# Patient Record
Sex: Male | Born: 1970
Health system: Southern US, Community
[De-identification: ages and names within clinical notes are randomized; demographics above are authoritative.]

## PROBLEM LIST (undated history)

## (undated) DIAGNOSIS — F32A Depression, unspecified: Secondary | ICD-10-CM

## (undated) DIAGNOSIS — F329 Major depressive disorder, single episode, unspecified: Secondary | ICD-10-CM

## (undated) DIAGNOSIS — F419 Anxiety disorder, unspecified: Secondary | ICD-10-CM

## (undated) DIAGNOSIS — C801 Malignant (primary) neoplasm, unspecified: Secondary | ICD-10-CM

## (undated) HISTORY — PX: TESTICLE REMOVAL: SHX68

## (undated) HISTORY — PX: NECK SURGERY: SHX720

## (undated) HISTORY — PX: JOINT REPLACEMENT: SHX530

---

## 2008-04-22 ENCOUNTER — Emergency Department: Payer: Self-pay | Admitting: Emergency Medicine

## 2008-04-24 ENCOUNTER — Ambulatory Visit: Payer: Self-pay | Admitting: Urology

## 2008-05-05 ENCOUNTER — Ambulatory Visit: Payer: Self-pay | Admitting: Internal Medicine

## 2008-05-09 ENCOUNTER — Ambulatory Visit: Payer: Self-pay | Admitting: Urology

## 2008-05-10 ENCOUNTER — Ambulatory Visit: Payer: Self-pay | Admitting: Internal Medicine

## 2008-06-05 ENCOUNTER — Ambulatory Visit: Payer: Self-pay | Admitting: Internal Medicine

## 2008-07-05 ENCOUNTER — Ambulatory Visit: Payer: Self-pay | Admitting: Internal Medicine

## 2008-09-06 ENCOUNTER — Ambulatory Visit: Payer: Self-pay | Admitting: Internal Medicine

## 2008-10-05 ENCOUNTER — Ambulatory Visit: Payer: Self-pay | Admitting: Internal Medicine

## 2009-02-05 ENCOUNTER — Ambulatory Visit: Payer: Self-pay

## 2009-02-22 ENCOUNTER — Ambulatory Visit: Payer: Self-pay

## 2009-03-05 ENCOUNTER — Ambulatory Visit: Payer: Self-pay

## 2009-06-07 ENCOUNTER — Ambulatory Visit: Payer: Self-pay | Admitting: Internal Medicine

## 2009-06-19 ENCOUNTER — Ambulatory Visit: Payer: Self-pay | Admitting: Internal Medicine

## 2009-09-30 ENCOUNTER — Ambulatory Visit: Payer: Self-pay | Admitting: Nephrology

## 2009-12-04 IMAGING — CT CT CHEST-ABD-PELV W/ CM
1 of 3 series · 13 of 32 positions shown, 18 images · non-contrast
Comparison: No comparison.

REASON FOR EXAM: testicular  ca
COMMENTS:

PROCEDURE:     CT  - CT CHEST ABDOMEN AND PELVIS W  - May 09, 2008 [DATE]
RESULT:     CT CHEST, ABDOMEN, AND PELVIS
HISTORY: Testicular cancer.
TECHNIQUE: Multiple axial images obtained from the thoracic inlet to the
pubic symphysis, with p.o. contrast and with 100 mL of Zsovue-S63
intravenous contrast.

[Series 2: soft tissue · axial · 0.87mm/px · z∈[-1068,-438]mm · 13 of 142 slices shown, 18 images]
[im 8/142  soft-tissue]
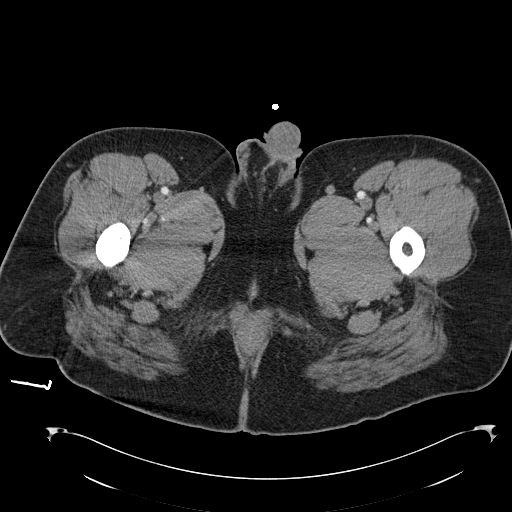
[im 8/142  bone]
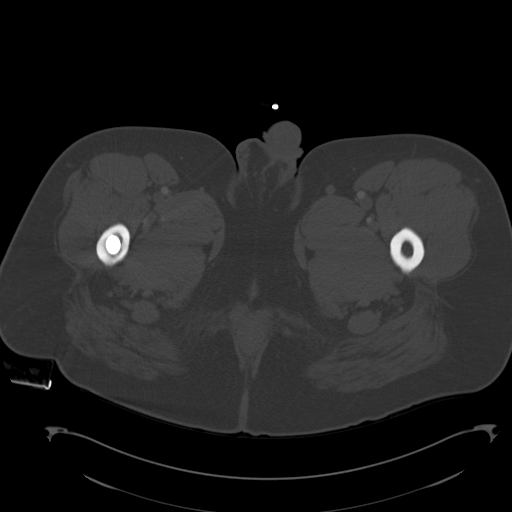
[im 22/142  soft-tissue]
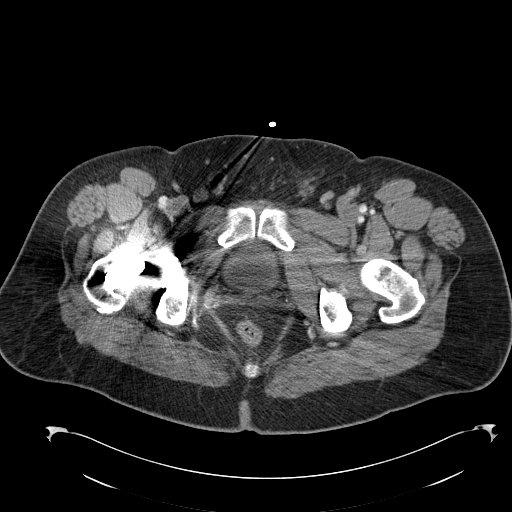
[im 29/142  soft-tissue]
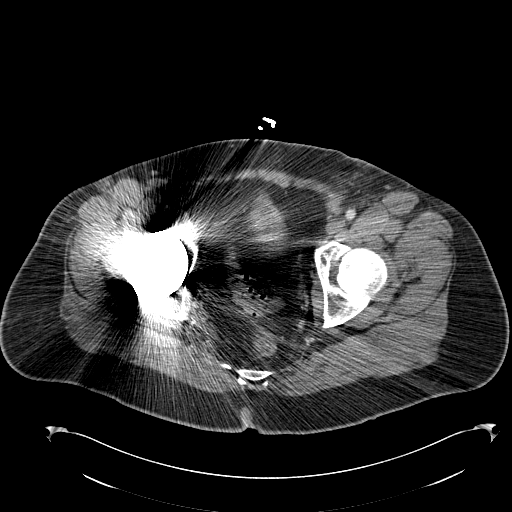
[im 43/142  soft-tissue]
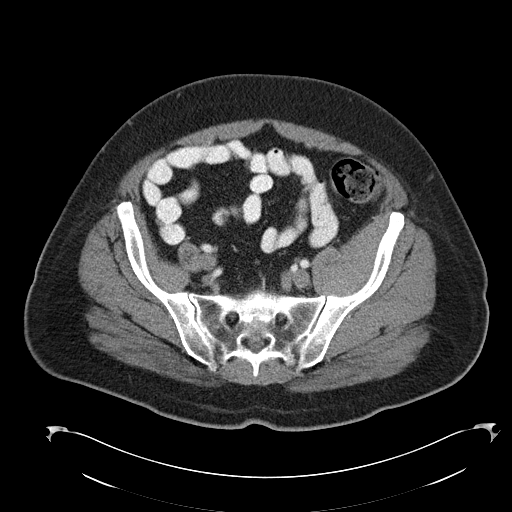
[im 57/142  soft-tissue]
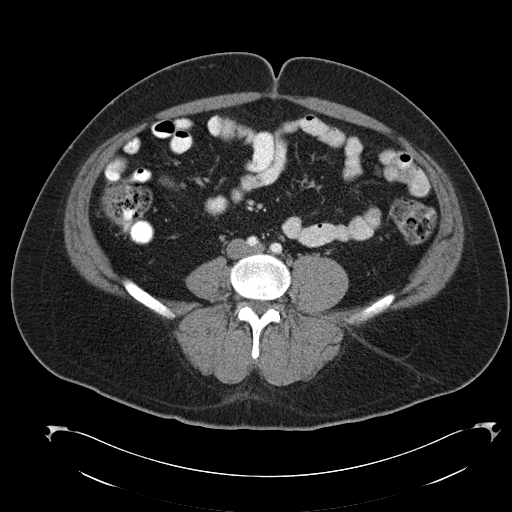
[im 64/142  soft-tissue]
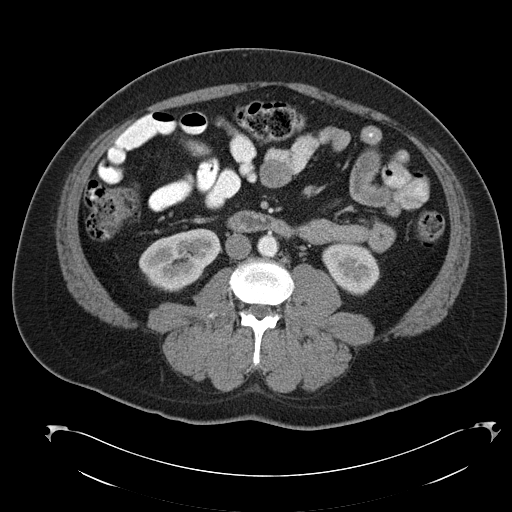
[im 78/142  soft-tissue]
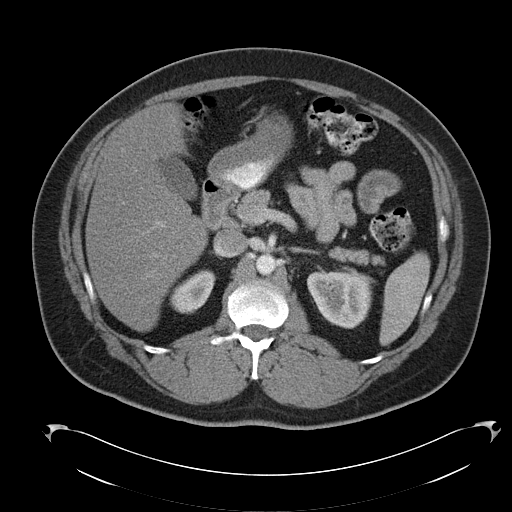
[im 85/142  soft-tissue]
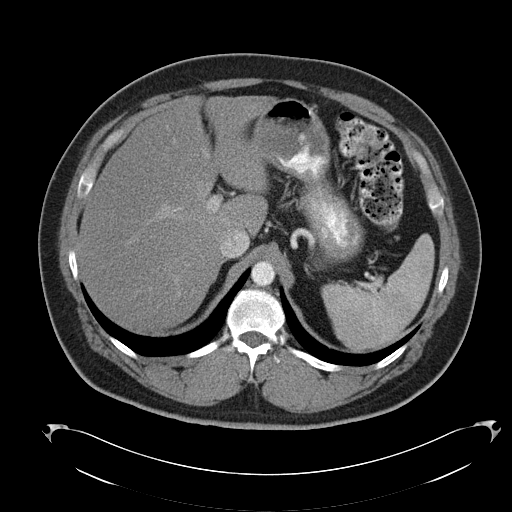
[im 99/142  soft-tissue]
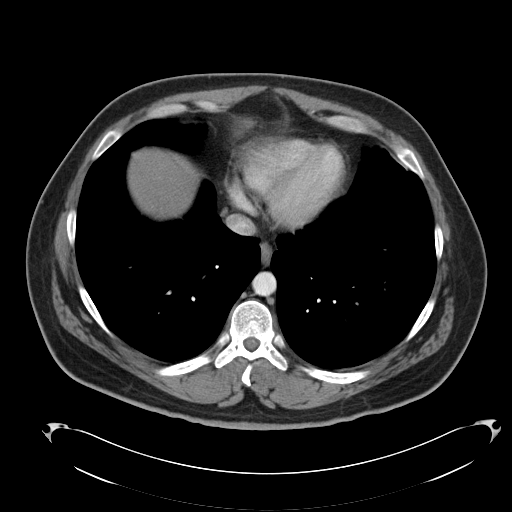
[im 99/142  bone]
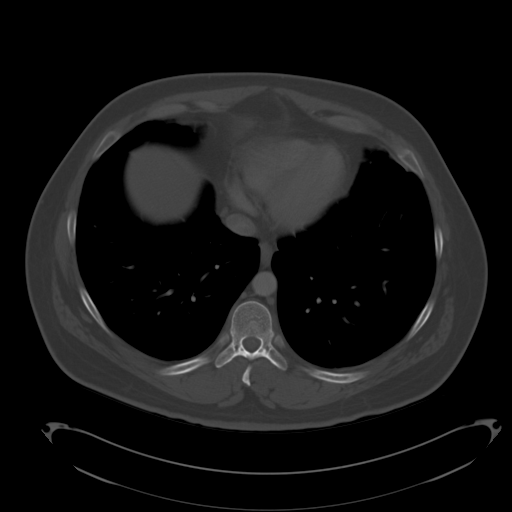
[im 113/142  soft-tissue]
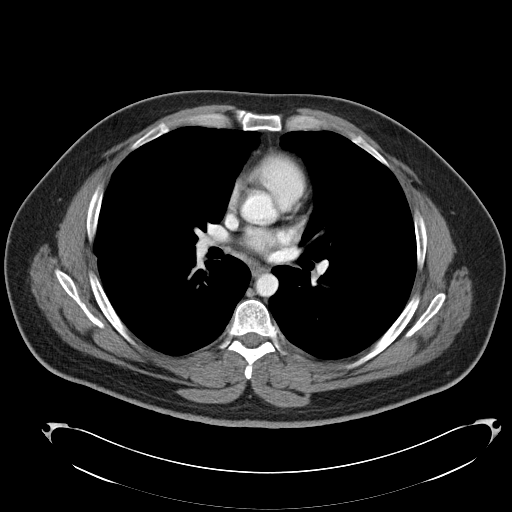
[im 113/142  lung]
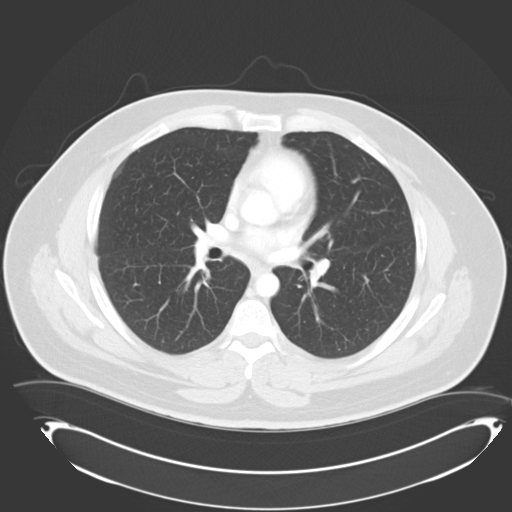
[im 120/142  soft-tissue]
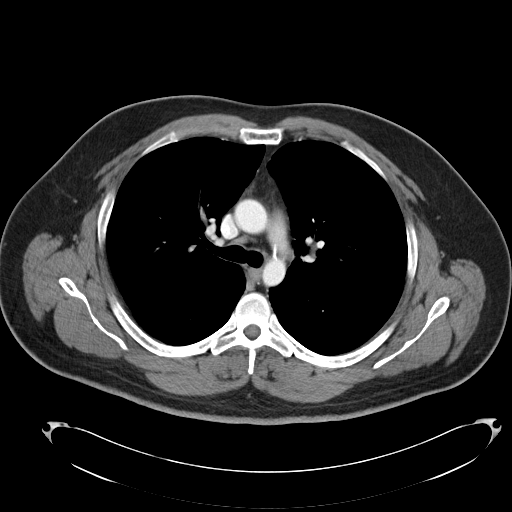
[im 120/142  lung]
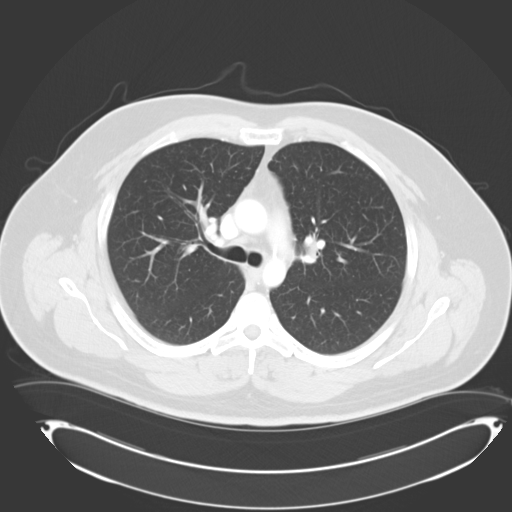
[im 127/142  lung]
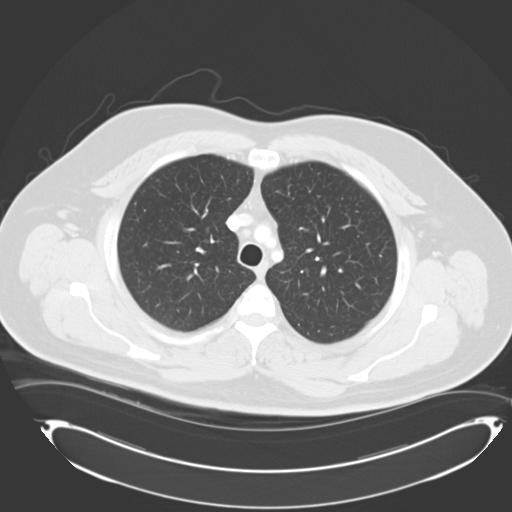
[im 134/142  soft-tissue]
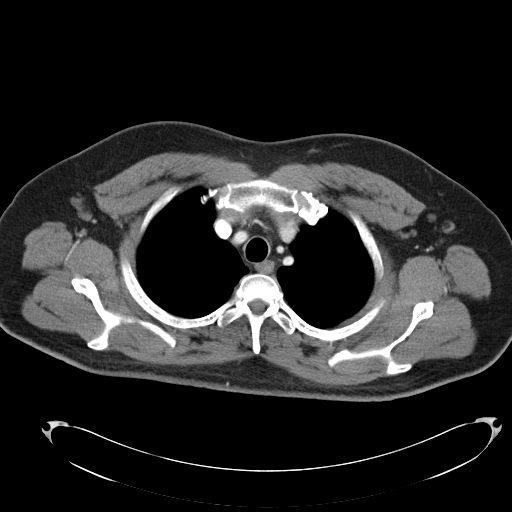
[im 134/142  lung]
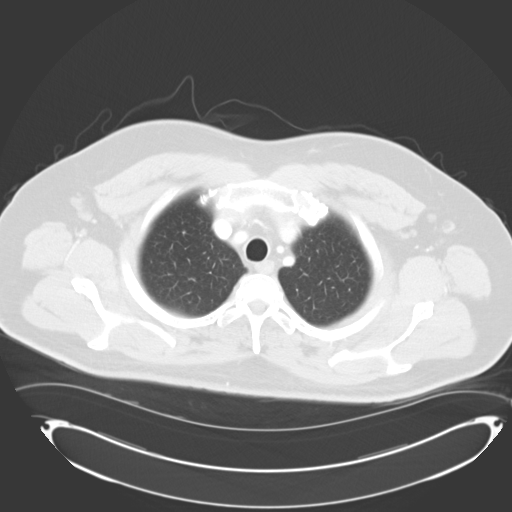

[13 of 32 positions shown; findings below may reference images not displayed]

FINDINGS: CHEST:

There is a calcified left upper lobe pulmonary nodule likely resenting
sequela prior granulomatous disease. There is a 2 mm pulmonary nodule in the
right upper lobe on image 16. There is no focal mass. The lungs are
otherwise clear. There is no focal parenchymal opacity, pleural effusion, or
pneumothorax.

The heart size is normal. There is no pericardial effusion.

There are no pathologically enlarged mediastinal, hilar, or axillary lymph
nodes.

The osseous structures demonstrate no focal abnormality.

ABDOMEN/PELVIS:

The liver is diffusely low in attenuation consistent with hepatic steatosis.
There is no intrahepatic or extrahepatic biliary ductal dilatation. The
gallbladder is unremarkable. The spleen demonstrates no focal abnormality.
The kidneys, adrenal glands, pancreas are normal. The bladder is
unremarkable.

The stomach, duodenum, small intestine, and large intestine demonstrate no
contrast extravasation or dilatation. There is no pneumoperitoneum,
pneumatosis, or portal venous gas. There is no abdominal or pelvic free
fluid. There multiple nonpathologically enlarged inguinal lymph nodes with
the largest measuring 10 mm on the right. There is a small left para-aortic
lymph node proximally 4 cm inferior to the left renal artery measuring 8 x
12 mm.

The abdominal aorta is normal in caliber. There is mild scattered
atherosclerosis of the abdominal aorta.

There is a right total hip arthroplasty with beam hardening artifact from
the orthopedic hardware limiting evaluation of the adjacent soft tissue and
osseous structures.
IMPRESSION: 1. There multiple nonpathologically enlarged inguinal lymph nodes with the
largest measuring 10 mm on the right. There is a small left para-aortic
lymph node measuring 8 x 12 mm. These are of uncertain significance and may
represent reactive lymph nodes versus secondary to malignancy.

2. Calcified left upper lobe pulmonary nodule likely representing changes
secondary to prior granulomatous disease. There is a 2 mm right upper lobe
pulmonary nodule which likely represent sequela prior granulomatous disease
given the calcified nodule in the contralateral lung and less likely
secondary to metastatic disease although this cannot be entirely excluded.

## 2010-01-22 ENCOUNTER — Ambulatory Visit: Payer: Self-pay | Admitting: Family Medicine

## 2010-03-11 ENCOUNTER — Ambulatory Visit: Payer: Self-pay | Admitting: Internal Medicine

## 2010-04-07 ENCOUNTER — Ambulatory Visit: Payer: Self-pay | Admitting: Internal Medicine

## 2010-04-08 LAB — BETA HCG QUANT (REF LAB)

## 2010-04-08 LAB — AFP TUMOR MARKER: AFP-Tumor Marker: 2 ng/mL (ref 0.0–8.3)

## 2010-05-06 ENCOUNTER — Ambulatory Visit: Payer: Self-pay | Admitting: Internal Medicine

## 2011-05-21 ENCOUNTER — Ambulatory Visit: Payer: Self-pay | Admitting: Internal Medicine

## 2011-05-21 LAB — COMPREHENSIVE METABOLIC PANEL
Albumin: 3.6 g/dL (ref 3.4–5.0)
Anion Gap: 9 (ref 7–16)
Chloride: 103 mmol/L (ref 98–107)
Co2: 28 mmol/L (ref 21–32)
Creatinine: 1.04 mg/dL (ref 0.60–1.30)
EGFR (African American): >60
EGFR (Non-African Amer.): >60
Osmolality: 278 (ref 275–301)
Potassium: 4.3 mmol/L (ref 3.5–5.1)
SGOT(AST): 26 U/L (ref 15–37)
SGPT (ALT): 41 U/L
Total Protein: 6.8 g/dL (ref 6.4–8.2)

## 2011-05-21 LAB — CBC CANCER CENTER
Basophil #: 0.1 x10 3/mm (ref 0.0–0.1)
HCT: 40.7 % (ref 40.0–52.0)
Lymphocyte #: 2.3 x10 3/mm (ref 1.0–3.6)
Lymphocyte %: 35.6 %
MCH: 28.6 pg (ref 26.0–34.0)
MCHC: 33.4 g/dL (ref 32.0–36.0)
MCV: 86 fL (ref 80–100)
Monocyte %: 7.3 %
Neutrophil #: 3.4 x10 3/mm (ref 1.4–6.5)
Platelet: 194 x10 3/mm (ref 150–440)
RDW: 14.2 % (ref 11.5–14.5)
WBC: 6.5 x10 3/mm (ref 3.8–10.6)

## 2011-05-21 LAB — PROTIME-INR: INR: 0.9

## 2011-05-26 LAB — AFP TUMOR MARKER: AFP-Tumor Marker: 1.5 ng/mL

## 2011-05-27 LAB — CULTURE, BLOOD (SINGLE)

## 2011-06-06 ENCOUNTER — Ambulatory Visit: Payer: Self-pay | Admitting: Internal Medicine

## 2011-12-08 ENCOUNTER — Ambulatory Visit: Payer: Self-pay | Admitting: Internal Medicine

## 2011-12-08 LAB — RAPID STREP-A WITH REFLX: Micro Text Report: NEGATIVE

## 2011-12-11 LAB — BETA STREP CULTURE(ARMC)

## 2012-06-07 ENCOUNTER — Ambulatory Visit: Payer: Self-pay | Admitting: Family Medicine

## 2012-10-24 ENCOUNTER — Ambulatory Visit: Payer: Self-pay

## 2012-10-28 ENCOUNTER — Ambulatory Visit: Payer: Self-pay | Admitting: Family Medicine

## 2013-03-20 ENCOUNTER — Ambulatory Visit: Payer: Self-pay | Admitting: Physician Assistant

## 2013-03-22 ENCOUNTER — Ambulatory Visit: Payer: Self-pay | Admitting: Physician Assistant

## 2013-09-04 ENCOUNTER — Ambulatory Visit: Payer: Self-pay | Admitting: Physician Assistant

## 2013-11-13 ENCOUNTER — Ambulatory Visit: Payer: Self-pay | Admitting: Emergency Medicine

## 2013-11-13 LAB — CBC WITH DIFFERENTIAL/PLATELET
Basophil #: 0 10*3/uL (ref 0.0–0.1)
Basophil %: 0.8 %
Eosinophil #: 0.1 10*3/uL (ref 0.0–0.7)
Eosinophil %: 1.7 %
HCT: 44.7 % (ref 40.0–52.0)
HGB: 14.7 g/dL (ref 13.0–18.0)
Lymphocyte #: 2 10*3/uL (ref 1.0–3.6)
Lymphocyte %: 31.6 %
MCH: 28.9 pg (ref 26.0–34.0)
MCHC: 32.8 g/dL (ref 32.0–36.0)
MCV: 88 fL (ref 80–100)
Monocyte #: 0.6 x10 3/mm (ref 0.2–1.0)
Monocyte %: 9.2 %
Neutrophil #: 3.6 10*3/uL (ref 1.4–6.5)
Neutrophil %: 56.7 %
Platelet: 237 10*3/uL (ref 150–440)
RBC: 5.07 10*6/uL (ref 4.40–5.90)
RDW: 13 % (ref 11.5–14.5)
WBC: 6.3 10*3/uL (ref 3.8–10.6)

## 2013-11-13 LAB — COMPREHENSIVE METABOLIC PANEL
Albumin: 3.8 g/dL (ref 3.4–5.0)
Alkaline Phosphatase: 67 U/L
Anion Gap: 8 (ref 7–16)
BUN: 13 mg/dL (ref 7–18)
Bilirubin,Total: 0.4 mg/dL (ref 0.2–1.0)
Calcium, Total: 9.1 mg/dL (ref 8.5–10.1)
Chloride: 103 mmol/L (ref 98–107)
Co2: 28 mmol/L (ref 21–32)
Creatinine: 1.02 mg/dL (ref 0.60–1.30)
EGFR (African American): >60
EGFR (Non-African Amer.): >60
Glucose: 95 mg/dL (ref 65–99)
Osmolality: 277 (ref 275–301)
Potassium: 4 mmol/L (ref 3.5–5.1)
SGOT(AST): 29 U/L (ref 15–37)
SGPT (ALT): 50 U/L
Sodium: 139 mmol/L (ref 136–145)
Total Protein: 7.3 g/dL (ref 6.4–8.2)

## 2013-11-13 LAB — URIC ACID: Uric Acid: 6.2 mg/dL (ref 3.5–7.2)

## 2014-02-06 ENCOUNTER — Emergency Department: Payer: Self-pay | Admitting: Emergency Medicine

## 2014-02-06 LAB — COMPREHENSIVE METABOLIC PANEL
ALBUMIN: 4.1 g/dL (ref 3.4–5.0)
ALK PHOS: 70 U/L (ref 46–116)
Anion Gap: 13 (ref 7–16)
BILIRUBIN TOTAL: 0.3 mg/dL (ref 0.2–1.0)
BUN: 11 mg/dL (ref 7–18)
CHLORIDE: 108 mmol/L — AB (ref 98–107)
Calcium, Total: 8.5 mg/dL (ref 8.5–10.1)
Co2: 24 mmol/L (ref 21–32)
Creatinine: 1.06 mg/dL (ref 0.60–1.30)
EGFR (African American): >60
EGFR (Non-African Amer.): >60
GLUCOSE: 116 mg/dL — AB (ref 65–99)
OSMOLALITY: 289 (ref 275–301)
POTASSIUM: 3.9 mmol/L (ref 3.5–5.1)
SGOT(AST): 91 U/L — ABNORMAL HIGH (ref 15–37)
SGPT (ALT): 127 U/L — ABNORMAL HIGH (ref 14–63)
SODIUM: 145 mmol/L (ref 136–145)
Total Protein: 7.4 g/dL (ref 6.4–8.2)

## 2014-02-06 LAB — CBC
HCT: 43.2 % (ref 40.0–52.0)
HGB: 14.3 g/dL (ref 13.0–18.0)
MCH: 28.9 pg (ref 26.0–34.0)
MCHC: 33.2 g/dL (ref 32.0–36.0)
MCV: 87 fL (ref 80–100)
Platelet: 225 10*3/uL (ref 150–440)
RBC: 4.96 10*6/uL (ref 4.40–5.90)
RDW: 13.9 % (ref 11.5–14.5)
WBC: 5.9 10*3/uL (ref 3.8–10.6)

## 2014-02-06 LAB — ACETAMINOPHEN LEVEL: Acetaminophen: 2 ug/mL — ABNORMAL LOW

## 2014-02-06 LAB — ETHANOL: Ethanol: 297 mg/dL

## 2014-02-06 LAB — SALICYLATE LEVEL: Salicylates, Serum: 4.3 mg/dL — ABNORMAL HIGH

## 2015-11-14 ENCOUNTER — Encounter: Payer: Self-pay | Admitting: *Deleted

## 2015-11-14 ENCOUNTER — Ambulatory Visit
Admission: EM | Admit: 2015-11-14 | Discharge: 2015-11-14 | Disposition: A | Discharge status: Home / Self Care | Payer: Managed Care, Other (non HMO) | Attending: Emergency Medicine | Admitting: Emergency Medicine

## 2015-11-14 ENCOUNTER — Ambulatory Visit (INDEPENDENT_AMBULATORY_CARE_PROVIDER_SITE_OTHER): Payer: Managed Care, Other (non HMO)

## 2015-11-14 DIAGNOSIS — J40 Bronchitis, not specified as acute or chronic: Secondary | ICD-10-CM | POA: Diagnosis not present

## 2015-11-14 HISTORY — DX: Depression, unspecified: F32.A

## 2015-11-14 HISTORY — DX: Major depressive disorder, single episode, unspecified: F32.9

## 2015-11-14 HISTORY — DX: Malignant (primary) neoplasm, unspecified: C80.1

## 2015-11-14 HISTORY — DX: Anxiety disorder, unspecified: F41.9

## 2015-11-14 MED ORDER — PREDNISONE 20 MG PO TABS: 40.0000 mg | ORAL_TABLET | Freq: Every day | ORAL | 0 refills | Status: DC

## 2015-11-14 MED ORDER — HYDROCOD POLST-CPM POLST ER 10-8 MG/5ML PO SUER: 5.0000 mL | Freq: Every evening | ORAL | 0 refills | Status: AC | PRN

## 2015-11-14 MED ORDER — DOXYCYCLINE HYCLATE 100 MG PO CAPS: 100.0000 mg | ORAL_CAPSULE | Freq: Two times a day (BID) | ORAL | 0 refills | Status: AC

## 2015-11-14 MED ORDER — BENZONATATE 100 MG PO CAPS: 100.0000 mg | ORAL_CAPSULE | Freq: Three times a day (TID) | ORAL | 0 refills | Status: AC | PRN

## 2015-11-14 MED ORDER — IPRATROPIUM-ALBUTEROL 0.5-2.5 (3) MG/3ML IN SOLN
3.0000 mL | Freq: Once | RESPIRATORY_TRACT | Status: AC
  Administered 2015-11-14: 3 mL via RESPIRATORY_TRACT

## 2015-11-14 MED ORDER — ALBUTEROL SULFATE HFA 108 (90 BASE) MCG/ACT IN AERS: 2.0000 | INHALATION_SPRAY | RESPIRATORY_TRACT | 0 refills | Status: AC | PRN

## 2015-11-14 NOTE — Discharge Instructions (Signed)
Take medication as prescribed. Rest. Drink plenty of fluids.  ° °Follow up with your primary care physician this week as needed. Return to Urgent care for new or worsening concerns.  ° °

## 2015-11-14 NOTE — ED Triage Notes (Signed)
Patient started having symptoms of nasal congestion and headache 1 week ago. 2 days ago patient started having symptoms of chest congestion and cough. No sore throat reported.

## 2015-11-14 NOTE — ED Provider Notes (Signed)
[facility] ____________________________________________  Time seen: Approximately 12:49 PM  I have reviewed the triage vital signs and the nursing notes.   HISTORY  Chief Complaint Nasal Congestion; Cough; and Headache   HPI Kyle Bruce is a 45 y.o. male presenting for complaints of 1.5 weeks of nasal congestion, cough, sore throat, and reports in the last 2-3 days more of a cough. Patient reports nasal congestion has improved. Patient states cough is a dry intermittent cough with occasional associated wheezing. Patient states cough and wheezing noted worse at night. Denies known fevers, but reports did feel warm today. Reports multiple coworkers sick with similar recently. Denies home sick contacts. Reports unresolving over-the-counter cough and congestion medications. Denies any pain at this time.  Denies chest pain, shortness of breath or chest pain with deep breath. Denies dysuria, abdominal pain, extremity pain. Patient reports he has chronic bilateral lower extremity swelling in which he uses compression wraps, but denies any acute changes in his chronic swelling. Denies recent sickness or recent antibiotic use. Reports former smoker. Denies cardiac history. Denies renal insufficiency.  [facility]: PCP  Past Medical History:  Diagnosis Date  . Anxiety   . Cancer [facility])    testicular cancer  . Depression     There are no active problems to display for this patient.   Past Surgical History:  Procedure Laterality Date  . JOINT REPLACEMENT    . NECK SURGERY    . TESTICLE REMOVAL      Current Outpatient Rx  . Order #: [id number] Class: Historical Med  . Order #: [id number] Class: Historical Med  . Order #: [id number] Class: Normal  . Order #: [id number] Class: Normal  . Order #: [id number] Class: Print  . Order #: [id number] Class: Normal  . Order #: [id number] Class: Normal    No current facility-administered medications for this encounter.    Current Outpatient Prescriptions:  .  buPROPion (WELLBUTRIN XL) 300 MG 24 hr tablet, Take 300 mg by mouth daily., Disp: , Rfl:  .  hydrOXYzine (VISTARIL) 50 MG capsule, Take 100 mg by mouth at bedtime., Disp: , Rfl:  .  albuterol (PROVENTIL HFA;VENTOLIN HFA) 108 (90 Base) MCG/ACT inhaler, Inhale 2 puffs into the lungs every 4 (four) hours as needed., Disp: 1 Inhaler, Rfl: 0 .  benzonatate (TESSALON PERLES) 100 MG capsule, Take 1 capsule (100 mg total) by mouth 3 (three) times daily as needed., Disp: 15 capsule, Rfl: 0 .  chlorpheniramine-HYDROcodone (TUSSIONEX PENNKINETIC ER) 10-8 MG/5ML SUER, Take 5 mLs by mouth at bedtime as needed. do not drive or operate machinery while taking as can cause drowsiness., Disp: 75 mL, Rfl: 0 .  doxycycline (VIBRAMYCIN) 100 MG capsule, Take 1 capsule (100 mg total) by mouth 2 (two) times daily., Disp: 20 capsule, Rfl: 0 .  predniSONE (DELTASONE) 20 MG tablet, Take 2 tablets (40 mg total) by mouth daily., Disp: 10 tablet, Rfl: 0  Allergies Patient has no known allergies.  History reviewed. No pertinent family history.  Social History Social History  Substance Use Topics  . Smoking status: Former Smoker    Types: E-cigarettes  . Smokeless tobacco: Former Systems developer  . Alcohol use Yes    Review of Systems Constitutional: No fever/chills Eyes: No visual changes. ENT: No sore throat.As above.  Cardiovascular: Denies chest pain. Respiratory: Denies shortness of breath. Gastrointestinal: No abdominal pain.  No nausea, no vomiting.  No diarrhea.  No constipation. Genitourinary: Negative for dysuria. Musculoskeletal: Negative for back pain. Skin: Negative for rash. Neurological: Negative for headaches,  focal weakness or numbness.  10-point ROS otherwise negative.  ____________________________________________   PHYSICAL EXAM:  VITAL SIGNS: ED Triage Vitals  Enc Vitals Group     BP 11/14/15 1239 (!) 101/54     Pulse Rate 11/14/15 1239 95     Resp  11/14/15 1239 18     Temp 11/14/15 1239 97.6 F (36.4 C)     Temp Source 11/14/15 1239 Oral     SpO2 11/14/15 1239 99 %     Weight 11/14/15 1240 (!) 310 lb (140.6 kg)     Height 11/14/15 1240 5\' 10"  (1.778 m)     Head Circumference --      Peak Flow --      Pain Score --      Pain Loc --      Pain Edu? --      Excl. in [place]? --    Constitutional: Alert and oriented. Well appearing and in no acute distress. Eyes: Conjunctivae are normal. PERRL. EOMI. Head: Atraumatic. No sinus tenderness to palpation. No swelling. No erythema.  Ears: no erythema, normal TMs bilaterally.   Nose:Nasal congestion with clear rhinorrhea  Mouth/Throat: Mucous membranes are moist. Mild pharyngeal erythema. No tonsillar swelling or exudate.  Neck: No stridor.  No cervical spine tenderness to palpation. Hematological/Lymphatic/Immunilogical: No cervical lymphadenopathy. Cardiovascular: Normal rate, regular rhythm. Grossly normal heart sounds.  Good peripheral circulation. Respiratory: Normal respiratory effort.  No retractions.Very mild scattered rhonchi. Mild inspiratory and expiratory wheeze noted. Speaks in complete sentences. Dry intermittent cough noted in room. Good air movement.  Gastrointestinal: Soft and nontender. Obese abdomen. No CVA tenderness. Musculoskeletal: Ambulatory with steady gait. No cervical, thoracic or lumbar tenderness to palpation.  Neurologic:  Normal speech and language. No gross focal neurologic deficits are appreciated. No gait instability. Skin:  Skin is warm, dry and intact. No rash noted. Psychiatric: Mood and affect are normal. Speech and behavior are normal. ___________________________________________   LABS (all labs ordered are listed, but only abnormal results are displayed)  Labs Reviewed - No data to display ____________________________________________  RADIOLOGY  Dg Chest 2 View  Result Date: 11/14/2015 CLINICAL DATA:  Cough and low-grade fever for 1 week EXAM:  CHEST  2 VIEW COMPARISON:  None. FINDINGS: Cardiac shadow is within normal limits. The lungs are well aerated bilaterally. No focal infiltrate or sizable effusion is seen. No bony abnormality is noted. IMPRESSION: No active cardiopulmonary disease. Electronically Signed   By: Inez Catalina M.D.   On: 11/14/2015 13:15   ____________________________________________   PROCEDURES Procedures    INITIAL IMPRESSION / ASSESSMENT AND PLAN / ED COURSE  Pertinent labs & imaging results that were available during my care of the patient were reviewed by me and considered in my medical decision making (see chart for details).  Very well-appearing patient. Presents for complaints of 1.5 weeks of cough and nasal congestion, with gradual onset of increased cough with intermittent wheezing. Suspect bronchitis, will evaluate chest x-ray. Albuterol ipratropium DuoNeb given once in urgent care.   After duoneb, wheezes fully resolved and patient reports that he is feeling much better. Chest x-ray reviewed, per radiologist no active cardiopulmonary disease. Will treat patient with oral doxycycline, prednisone taper, when necessary albuterol inhaler, when necessary Tessalon Perles and prn tussionex at night. [place] controlled substance database reviewed, with most recent controlled rx 09/04/15 oxycodone #30.   Discussed follow up with Primary care physician this week. Discussed follow up and return parameters including no resolution or any worsening concerns. Patient  verbalized understanding and agreed to plan.   ____________________________________________   FINAL CLINICAL IMPRESSION(S) / ED DIAGNOSES  Final diagnoses:  Bronchitis     Discharge Medication List as of 11/14/2015  1:55 PM    START taking these medications   Details  albuterol (PROVENTIL HFA;VENTOLIN HFA) 108 (90 Base) MCG/ACT inhaler Inhale 2 puffs into the lungs every 4 (four) hours as needed., Starting Thu 11/14/2015, Normal    benzonatate  (TESSALON PERLES) 100 MG capsule Take 1 capsule (100 mg total) by mouth 3 (three) times daily as needed., Starting Thu 11/14/2015, Normal    chlorpheniramine-HYDROcodone (TUSSIONEX PENNKINETIC ER) 10-8 MG/5ML SUER Take 5 mLs by mouth at bedtime as needed. do not drive or operate machinery while taking as can cause drowsiness., Starting Thu 11/14/2015, Print    doxycycline (VIBRAMYCIN) 100 MG capsule Take 1 capsule (100 mg total) by mouth 2 (two) times daily., Starting Thu 11/14/2015, Normal    predniSONE (DELTASONE) 20 MG tablet Take 2 tablets (40 mg total) by mouth daily., Starting Thu 11/14/2015, Normal        Note: This dictation was prepared with Dragon dictation along with smaller phrase technology. Any transcriptional errors that result from this process are unintentional.    Clinical Course       Marylene Land, NP 11/14/15 [phone]

## 2015-11-15 ENCOUNTER — Ambulatory Visit
Admission: EM | Admit: 2015-11-15 | Discharge: 2015-11-15 | Disposition: A | Discharge status: Home / Self Care | Payer: Managed Care, Other (non HMO)

## 2015-11-15 NOTE — ED Triage Notes (Signed)
Patient seen yesterday at [facility].  Patient states that he does not feel well enough to go to work today and needs a work note for today.

## 2015-11-15 NOTE — ED Notes (Addendum)
Patient states that his symptoms are not worse. Patient states that he does not want to see a physician but just needs a work note for today.  Dr. Zenda Alpers okay with giving him a work note for today since he was just seen yesterday.  Patient advised to follow-up here or with PCP if his symptoms persist or worsen.  Patient verbalized understanding.

## 2015-11-20 ENCOUNTER — Ambulatory Visit
Admission: EM | Admit: 2015-11-20 | Discharge: 2015-11-20 | Disposition: A | Discharge status: Home / Self Care | Payer: Managed Care, Other (non HMO) | Attending: Family Medicine | Admitting: Family Medicine

## 2015-11-20 DIAGNOSIS — J40 Bronchitis, not specified as acute or chronic: Secondary | ICD-10-CM | POA: Diagnosis not present

## 2015-11-20 MED ORDER — AZITHROMYCIN 250 MG PO TABS: ORAL_TABLET | ORAL | 0 refills | Status: AC

## 2015-11-20 MED ORDER — PREDNISONE 20 MG PO TABS: ORAL_TABLET | ORAL | 0 refills | Status: AC

## 2015-11-20 NOTE — ED Provider Notes (Signed)
[facility]    CSN: [id number] Arrival date & time: 11/20/15  1125     History   Chief Complaint Chief Complaint  Patient presents with  . Bronchitis    HPI Kyle Bruce is a 45 y.o. male.   45 yo male presents with a c/o productive cough, fevers, and wheezing that's not improved. Patient was seen here on 11/14/15, diagnosed with bronchitis and prescribed medications as per chart. Patient states he has not noticed any improvement.    The history is provided by the patient.    Past Medical History:  Diagnosis Date  . Anxiety   . Cancer [facility])    testicular cancer  . Depression     There are no active problems to display for this patient.   Past Surgical History:  Procedure Laterality Date  . JOINT REPLACEMENT    . NECK SURGERY    . TESTICLE REMOVAL         Home Medications    Prior to Admission medications   Medication Sig Start Date End Date Taking? Authorizing Provider  albuterol (PROVENTIL HFA;VENTOLIN HFA) 108 (90 Base) MCG/ACT inhaler Inhale 2 puffs into the lungs every 4 (four) hours as needed. 11/14/15  Yes Marylene Land, NP  benzonatate (TESSALON PERLES) 100 MG capsule Take 1 capsule (100 mg total) by mouth 3 (three) times daily as needed. 11/14/15  Yes Marylene Land, NP  buPROPion (WELLBUTRIN XL) 300 MG 24 hr tablet Take 300 mg by mouth daily.   Yes Historical Provider, MD  chlorpheniramine-HYDROcodone (TUSSIONEX PENNKINETIC ER) 10-8 MG/5ML SUER Take 5 mLs by mouth at bedtime as needed. do not drive or operate machinery while taking as can cause drowsiness. 11/14/15  Yes Marylene Land, NP  doxycycline (VIBRAMYCIN) 100 MG capsule Take 1 capsule (100 mg total) by mouth 2 (two) times daily. 11/14/15  Yes Marylene Land, NP  hydrOXYzine (VISTARIL) 50 MG capsule Take 100 mg by mouth at bedtime.   Yes Historical Provider, MD  azithromycin (ZITHROMAX Z-PAK) 250 MG tablet 2 tabs po once day 1, then 1 tab po qd for next 4 days 11/20/15    Norval Gable, MD  predniSONE (DELTASONE) 20 MG tablet 3 tabs po qd for 2 days, then 2 tabs po qd for 3 days, then 1 tab po qd for 3 days, then half a tab po qd for 2 days 11/20/15   Norval Gable, MD    Family History History reviewed. No pertinent family history.  Social History Social History  Substance Use Topics  . Smoking status: Former Smoker    Types: E-cigarettes  . Smokeless tobacco: Former Systems developer  . Alcohol use Yes     Allergies   Patient has no known allergies.   Review of Systems Review of Systems   Physical Exam Triage Vital Signs ED Triage Vitals  Enc Vitals Group     BP 11/20/15 1254 (!) 155/81     Pulse Rate 11/20/15 1254 100     Resp 11/20/15 1254 16     Temp 11/20/15 1254 98.7 F (37.1 C)     Temp Source 11/20/15 1254 Oral     SpO2 11/20/15 1254 98 %     Weight 11/20/15 1253 (!) 310 lb (140.6 kg)     Height 11/20/15 1253 5\' 10"  (1.778 m)     Head Circumference --      Peak Flow --      Pain Score 11/20/15 1254 4     Pain Loc --  Pain Edu? --      Excl. in [place]? --    No data found.   Updated Vital Signs BP (!) 155/81 (BP Location: Left Arm)   Pulse 100   Temp 98.7 F (37.1 C) (Oral)   Resp 16   Ht 5\' 10"  (1.778 m)   Wt (!) 310 lb (140.6 kg)   SpO2 98%   BMI 44.48 kg/m   Visual Acuity Right Eye Distance:   Left Eye Distance:   Bilateral Distance:    Right Eye Near:   Left Eye Near:    Bilateral Near:     Physical Exam  Constitutional: He appears well-developed and well-nourished. No distress.  HENT:  Head: Normocephalic and atraumatic.  Right Ear: Tympanic membrane, external ear and ear canal normal.  Left Ear: Tympanic membrane, external ear and ear canal normal.  Nose: Nose normal.  Mouth/Throat: Uvula is midline, oropharynx is clear and moist and mucous membranes are normal. No oropharyngeal exudate or tonsillar abscesses.  Eyes: Conjunctivae and EOM are normal. Pupils are equal, round, and reactive to light. Right eye  exhibits no discharge. Left eye exhibits no discharge. No scleral icterus.  Neck: Normal range of motion. Neck supple. No tracheal deviation present. No thyromegaly present.  Cardiovascular: Normal rate, regular rhythm and normal heart sounds.   Pulmonary/Chest: Effort normal. No stridor. No respiratory distress. He has wheezes (diffuse expiratory as well as diffuse rhonchi). He has no rales. He exhibits no tenderness.  Lymphadenopathy:    He has no cervical adenopathy.  Neurological: He is alert.  Skin: Skin is warm and dry. No rash noted. He is not diaphoretic.  Nursing note and vitals reviewed.    UC Treatments / Results  Labs (all labs ordered are listed, but only abnormal results are displayed) Labs Reviewed - No data to display  EKG  EKG Interpretation None       Radiology No results found.  Procedures Procedures (including critical care time)  Medications Ordered in UC Medications - No data to display   Initial Impression / Assessment and Plan / UC Course  I have reviewed the triage vital signs and the nursing notes.  Pertinent labs & imaging results that were available during my care of the patient were reviewed by me and considered in my medical decision making (see chart for details).  Clinical Course       Final Clinical Impressions(s) / UC Diagnoses   Final diagnoses:  Bronchitis    New Prescriptions New Prescriptions   AZITHROMYCIN (ZITHROMAX Z-PAK) 250 MG TABLET    2 tabs po once day 1, then 1 tab po qd for next 4 days   PREDNISONE (DELTASONE) 20 MG TABLET    3 tabs po qd for 2 days, then 2 tabs po qd for 3 days, then 1 tab po qd for 3 days, then half a tab po qd for 2 days    1. diagnosis reviewed with patient 2. rx as per orders above; reviewed possible side effects, interactions, risks and benefits  3. Recommend supportive treatment with increased fluids; continue current inhaler  4. Follow-up prn if symptoms worsen or don't improve     Norval Gable, MD 11/20/15 1330

## 2015-11-20 NOTE — ED Triage Notes (Signed)
Patient complains of bronchitis. Patient states that his symptoms have not resolved. Patient states that he was here last week. Patient states that he is still having fevers and cough. Patient reports that he has not seen any improvement.
